# Patient Record
Sex: Female | Born: 1985 | Race: White | Hispanic: No | Marital: Single | Smoking: Never smoker
Health system: Southern US, Community
[De-identification: ages and names within clinical notes are randomized; demographics above are authoritative.]

## PROBLEM LIST (undated history)

## (undated) HISTORY — PX: BREAST REDUCTION SURGERY: SHX8

---

## 2014-09-19 ENCOUNTER — Emergency Department (HOSPITAL_COMMUNITY)
Admission: EM | Admit: 2014-09-19 | Discharge: 2014-09-19 | Disposition: A | Discharge status: Home / Self Care | Payer: BC Managed Care – PPO | Attending: Emergency Medicine | Admitting: Emergency Medicine

## 2014-09-19 ENCOUNTER — Encounter (HOSPITAL_COMMUNITY): Payer: Self-pay | Admitting: Emergency Medicine

## 2014-09-19 ENCOUNTER — Emergency Department (HOSPITAL_COMMUNITY): Payer: BC Managed Care – PPO

## 2014-09-19 DIAGNOSIS — IMO0002 Reserved for concepts with insufficient information to code with codable children: Secondary | ICD-10-CM | POA: Diagnosis not present

## 2014-09-19 DIAGNOSIS — S59919A Unspecified injury of unspecified forearm, initial encounter: Secondary | ICD-10-CM

## 2014-09-19 DIAGNOSIS — S6991XA Unspecified injury of right wrist, hand and finger(s), initial encounter: Secondary | ICD-10-CM

## 2014-09-19 DIAGNOSIS — S6990XA Unspecified injury of unspecified wrist, hand and finger(s), initial encounter: Secondary | ICD-10-CM

## 2014-09-19 DIAGNOSIS — Z3202 Encounter for pregnancy test, result negative: Secondary | ICD-10-CM | POA: Diagnosis not present

## 2014-09-19 DIAGNOSIS — S199XXA Unspecified injury of neck, initial encounter: Secondary | ICD-10-CM

## 2014-09-19 DIAGNOSIS — S0993XA Unspecified injury of face, initial encounter: Secondary | ICD-10-CM | POA: Diagnosis present

## 2014-09-19 DIAGNOSIS — S139XXA Sprain of joints and ligaments of unspecified parts of neck, initial encounter: Secondary | ICD-10-CM | POA: Diagnosis not present

## 2014-09-19 DIAGNOSIS — S20219A Contusion of unspecified front wall of thorax, initial encounter: Secondary | ICD-10-CM | POA: Insufficient documentation

## 2014-09-19 DIAGNOSIS — Y9389 Activity, other specified: Secondary | ICD-10-CM | POA: Diagnosis not present

## 2014-09-19 DIAGNOSIS — Y92410 Unspecified street and highway as the place of occurrence of the external cause: Secondary | ICD-10-CM | POA: Insufficient documentation

## 2014-09-19 DIAGNOSIS — S161XXA Strain of muscle, fascia and tendon at neck level, initial encounter: Secondary | ICD-10-CM

## 2014-09-19 DIAGNOSIS — S59909A Unspecified injury of unspecified elbow, initial encounter: Secondary | ICD-10-CM | POA: Diagnosis not present

## 2014-09-19 DIAGNOSIS — S298XXA Other specified injuries of thorax, initial encounter: Secondary | ICD-10-CM | POA: Insufficient documentation

## 2014-09-19 DIAGNOSIS — S3981XA Other specified injuries of abdomen, initial encounter: Secondary | ICD-10-CM | POA: Diagnosis not present

## 2014-09-19 DIAGNOSIS — T148XXA Other injury of unspecified body region, initial encounter: Secondary | ICD-10-CM

## 2014-09-19 LAB — URINE MICROSCOPIC-ADD ON

## 2014-09-19 LAB — CBC WITH DIFFERENTIAL/PLATELET
BASOS ABS: 0 10*3/uL (ref 0.0–0.1)
Basophils Relative: 0 % (ref 0–1)
Eosinophils Absolute: 0.1 10*3/uL (ref 0.0–0.7)
Eosinophils Relative: 1 % (ref 0–5)
HEMATOCRIT: 38.6 % (ref 36.0–46.0)
HEMOGLOBIN: 13 g/dL (ref 12.0–15.0)
LYMPHS PCT: 17 % (ref 12–46)
Lymphs Abs: 1.9 10*3/uL (ref 0.7–4.0)
MCH: 27 pg (ref 26.0–34.0)
MCHC: 33.7 g/dL (ref 30.0–36.0)
MCV: 80.1 fL (ref 78.0–100.0)
MONO ABS: 0.8 10*3/uL (ref 0.1–1.0)
Monocytes Relative: 7 % (ref 3–12)
Neutro Abs: 8.5 10*3/uL — ABNORMAL HIGH (ref 1.7–7.7)
Neutrophils Relative %: 75 % (ref 43–77)
Platelets: 261 10*3/uL (ref 150–400)
RBC: 4.82 MIL/uL (ref 3.87–5.11)
RDW: 13.4 % (ref 11.5–15.5)
WBC: 11.2 10*3/uL — AB (ref 4.0–10.5)

## 2014-09-19 LAB — URINALYSIS, ROUTINE W REFLEX MICROSCOPIC
Bilirubin Urine: NEGATIVE
GLUCOSE, UA: NEGATIVE mg/dL
KETONES UR: NEGATIVE mg/dL
LEUKOCYTES UA: NEGATIVE
Nitrite: NEGATIVE
PROTEIN: NEGATIVE mg/dL
Specific Gravity, Urine: 1.028 (ref 1.005–1.030)
UROBILINOGEN UA: 0.2 mg/dL (ref 0.0–1.0)
pH: 5.5 (ref 5.0–8.0)

## 2014-09-19 LAB — COMPREHENSIVE METABOLIC PANEL
ALT: 25 U/L (ref 0–35)
AST: 26 U/L (ref 0–37)
Albumin: 3.9 g/dL (ref 3.5–5.2)
Alkaline Phosphatase: 92 U/L (ref 39–117)
Anion gap: 13 (ref 5–15)
BILIRUBIN TOTAL: 0.3 mg/dL (ref 0.3–1.2)
BUN: 10 mg/dL (ref 6–23)
CHLORIDE: 108 meq/L (ref 96–112)
CO2: 21 mEq/L (ref 19–32)
CREATININE: 0.68 mg/dL (ref 0.50–1.10)
Calcium: 9.1 mg/dL (ref 8.4–10.5)
GFR calc Af Amer: >90 mL/min (ref >90)
GLUCOSE: 95 mg/dL (ref 70–99)
Potassium: 3.7 mEq/L (ref 3.7–5.3)
Sodium: 142 mEq/L (ref 137–147)
Total Protein: 7 g/dL (ref 6.0–8.3)

## 2014-09-19 LAB — POC URINE PREG, ED: Preg Test, Ur: NEGATIVE

## 2014-09-19 LAB — ETHANOL: Alcohol, Ethyl (B): <11 mg/dL (ref 0–11)

## 2014-09-19 MED ORDER — IOHEXOL 300 MG/ML  SOLN
100.0000 mL | Freq: Once | INTRAMUSCULAR | Status: AC | PRN
  Administered 2014-09-19: 100 mL via INTRAVENOUS

## 2014-09-19 MED ORDER — FENTANYL CITRATE 0.05 MG/ML IJ SOLN
50.0000 ug | Freq: Once | INTRAMUSCULAR | Status: AC
  Administered 2014-09-19: 50 ug via INTRAVENOUS
  Filled 2014-09-19: qty 2

## 2014-09-19 MED ORDER — METHOCARBAMOL 500 MG PO TABS: 500.0000 mg | ORAL_TABLET | Freq: Two times a day (BID) | ORAL | Status: AC

## 2014-09-19 MED ORDER — SODIUM CHLORIDE 0.9 % IV BOLUS (SEPSIS)
1000.0000 mL | Freq: Once | INTRAVENOUS | Status: AC
  Administered 2014-09-19: 1000 mL via INTRAVENOUS

## 2014-09-19 MED ORDER — HYDROCODONE-ACETAMINOPHEN 5-325 MG PO TABS: 1.0000 | ORAL_TABLET | Freq: Four times a day (QID) | ORAL | Status: AC | PRN

## 2014-09-19 MED ORDER — IBUPROFEN 600 MG PO TABS: 600.0000 mg | ORAL_TABLET | Freq: Four times a day (QID) | ORAL | Status: AC | PRN
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
